# Patient Record
Sex: Male | Born: 1935 | Race: White | Hispanic: No | Marital: Married
Health system: Southern US, Community
[De-identification: ages and names within clinical notes are randomized; demographics above are authoritative.]

---

## 2008-08-01 ENCOUNTER — Inpatient Hospital Stay (HOSPITAL_COMMUNITY): Admission: EM | Admit: 2008-08-01 | Discharge: 2008-08-03 | Payer: Self-pay | Admitting: Emergency Medicine

## 2010-04-11 LAB — POCT I-STAT, CHEM 8
Calcium, Ion: 1.12 mmol/L (ref 1.12–1.32)
HCT: 37 % — ABNORMAL LOW (ref 39.0–52.0)
TCO2: 20 mmol/L (ref 0–100)

## 2010-04-11 LAB — CBC
Hemoglobin: 13 g/dL (ref 13.0–17.0)
Platelets: 154 10*3/uL (ref 150–400)
RBC: 3.43 MIL/uL — ABNORMAL LOW (ref 4.22–5.81)
RDW: 12.6 % (ref 11.5–15.5)
WBC: 9.3 10*3/uL (ref 4.0–10.5)
WBC: 9.8 10*3/uL (ref 4.0–10.5)

## 2010-04-11 LAB — BASIC METABOLIC PANEL
Calcium: 8.7 mg/dL (ref 8.4–10.5)
Creatinine, Ser: 0.8 mg/dL (ref 0.4–1.5)
GFR calc Af Amer: >60 mL/min (ref >60)
GFR calc non Af Amer: >60 mL/min (ref >60)

## 2010-04-11 LAB — PROTIME-INR: INR: 1 (ref 0.00–1.49)

## 2010-04-11 LAB — LACTIC ACID, PLASMA: Lactic Acid, Venous: 1.2 mmol/L (ref 0.5–2.2)

## 2010-04-11 LAB — APTT: aPTT: 30 seconds (ref 24–37)
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
## 2015-06-24 NOTE — Progress Notes (Unsigned)
Patient in today for Week 2 Barostim neo device follow up. Denies any problems or concerns. States "I feel great." Pt's vital signs stable and tolerated increase in device amplitude 3.2 without any symptoms with sitting, standing, and ambulation. Dr. Myra GianottiBrabham present to assess patient, incisions healing well. Pt verb he will call with any questions or concerns or seek medical attention as needed.

## 2015-10-04 DEATH — deceased
